# Patient Record
Sex: Male | Born: 1990 | Race: White | Hispanic: No | Marital: Single | Smoking: Current every day smoker
Health system: Southern US, Community
[De-identification: ages and names within clinical notes are randomized; demographics above are authoritative.]

---

## 2013-08-26 ENCOUNTER — Emergency Department (HOSPITAL_COMMUNITY)
Admission: EM | Admit: 2013-08-26 | Discharge: 2013-08-26 | Disposition: A | Discharge status: Home / Self Care | Payer: No Typology Code available for payment source | Attending: Emergency Medicine | Admitting: Emergency Medicine

## 2013-08-26 ENCOUNTER — Encounter (HOSPITAL_COMMUNITY): Payer: Self-pay | Admitting: Emergency Medicine

## 2013-08-26 ENCOUNTER — Emergency Department (HOSPITAL_COMMUNITY): Payer: No Typology Code available for payment source

## 2013-08-26 DIAGNOSIS — S025XXA Fracture of tooth (traumatic), initial encounter for closed fracture: Secondary | ICD-10-CM | POA: Insufficient documentation

## 2013-08-26 DIAGNOSIS — F17200 Nicotine dependence, unspecified, uncomplicated: Secondary | ICD-10-CM | POA: Insufficient documentation

## 2013-08-26 DIAGNOSIS — Y939 Activity, unspecified: Secondary | ICD-10-CM | POA: Insufficient documentation

## 2013-08-26 DIAGNOSIS — S060X9A Concussion with loss of consciousness of unspecified duration, initial encounter: Secondary | ICD-10-CM | POA: Insufficient documentation

## 2013-08-26 DIAGNOSIS — S060XAA Concussion with loss of consciousness status unknown, initial encounter: Secondary | ICD-10-CM | POA: Insufficient documentation

## 2013-08-26 DIAGNOSIS — T148XXA Other injury of unspecified body region, initial encounter: Secondary | ICD-10-CM | POA: Insufficient documentation

## 2013-08-26 MED ORDER — OXYCODONE-ACETAMINOPHEN 5-325 MG PO TABS
2.0000 | ORAL_TABLET | Freq: Once | ORAL | Status: AC
  Administered 2013-08-26: 2 via ORAL
  Filled 2013-08-26: qty 2

## 2013-08-26 MED ORDER — METHOCARBAMOL 500 MG PO TABS
500.0000 mg | ORAL_TABLET | Freq: Two times a day (BID) | ORAL | Status: AC
Start: 2013-08-26 — End: ?

## 2013-08-26 MED ORDER — HYDROCODONE-ACETAMINOPHEN 5-325 MG PO TABS: 1.0000 | ORAL_TABLET | Freq: Four times a day (QID) | ORAL | Status: AC | PRN

## 2013-08-26 MED ORDER — IBUPROFEN 600 MG PO TABS: 600.0000 mg | ORAL_TABLET | Freq: Four times a day (QID) | ORAL | Status: AC | PRN

## 2013-08-26 NOTE — ED Notes (Signed)
Dr. Nanavati at bedside 
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
